# Patient Record
Sex: Female | Born: 2000 | Hispanic: Yes | Marital: Single | Smoking: Never smoker
Health system: Southern US, Community
[De-identification: ages and names within clinical notes are randomized; demographics above are authoritative.]

## PROBLEM LIST (undated history)

## (undated) DIAGNOSIS — N390 Urinary tract infection, site not specified: Secondary | ICD-10-CM

## (undated) DIAGNOSIS — N920 Excessive and frequent menstruation with regular cycle: Secondary | ICD-10-CM

## (undated) HISTORY — PX: APPENDECTOMY: SHX54

## (undated) HISTORY — DX: Excessive and frequent menstruation with regular cycle: N92.0

## (undated) HISTORY — DX: Urinary tract infection, site not specified: N39.0

---
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
## 2018-12-21 ENCOUNTER — Other Ambulatory Visit (HOSPITAL_COMMUNITY)
Admission: RE | Admit: 2018-12-21 | Discharge: 2018-12-21 | Disposition: A | Discharge status: Home / Self Care | Payer: Medicaid Other | Source: Ambulatory Visit | Attending: Obstetrics and Gynecology | Admitting: Obstetrics and Gynecology

## 2018-12-21 ENCOUNTER — Ambulatory Visit (INDEPENDENT_AMBULATORY_CARE_PROVIDER_SITE_OTHER): Payer: Self-pay | Admitting: Obstetrics and Gynecology

## 2018-12-21 ENCOUNTER — Encounter: Payer: Self-pay | Admitting: Obstetrics and Gynecology

## 2018-12-21 ENCOUNTER — Other Ambulatory Visit: Payer: Self-pay

## 2018-12-21 VITALS — BP 120/80 | Ht 60.0 in | Wt 184.6 lb

## 2018-12-21 DIAGNOSIS — Z113 Encounter for screening for infections with a predominantly sexual mode of transmission: Secondary | ICD-10-CM

## 2018-12-21 DIAGNOSIS — N926 Irregular menstruation, unspecified: Secondary | ICD-10-CM

## 2018-12-21 DIAGNOSIS — Z3202 Encounter for pregnancy test, result negative: Secondary | ICD-10-CM

## 2018-12-21 DIAGNOSIS — N939 Abnormal uterine and vaginal bleeding, unspecified: Secondary | ICD-10-CM

## 2018-12-21 DIAGNOSIS — Z6836 Body mass index (BMI) 36.0-36.9, adult: Secondary | ICD-10-CM

## 2018-12-21 LAB — POCT URINE PREGNANCY: Preg Test, Ur: NEGATIVE

## 2018-12-21 MED ORDER — MEDROXYPROGESTERONE ACETATE 10 MG PO TABS: 10.0000 mg | ORAL_TABLET | Freq: Every day | ORAL | 0 refills | Status: DC

## 2018-12-21 NOTE — Progress Notes (Signed)
Pediatrics, [facility] Complaint  Patient presents with  . Metrorrhagia    since last period has been on and off bleeding lasting from 1-2 weeks, no abnormal pain or any other symptoms, pt states most of the time she sees it when she wipes, not on pads    HPI:      [name] is a 18 y.o. G0P0000 who LMP was Patient's last menstrual period was 11/08/2018 (approximate)., presents today for irregular bleeding for the past 1-2 wks. Flow is light and usually noted with wiping only. Menses are usually Q1-4 months (since about age [age]), lasting 5 days, mod flow. No BTB usually, no dysmen. Pt had normal menses 4/20 but has had spotting only the past couple wks. No pelvic pain, LBP, fevers. No urin or vag sx. No hirsutism. No increased stress/wt changes recently, although up 20# from 1/19 appt..   Pt is sex active, using condoms. Neg STD testing 10/18. BC options discussed with Dr. Kenton Bautista 10/18. Has annual 7/20.   Past Medical History:  Diagnosis Date  . Menorrhagia   . UTI (urinary tract infection)     History reviewed. No pertinent surgical history.  Family History  Problem Relation Age of Onset  . Diabetes Father     Social History   Socioeconomic History  . Marital status: Single    Spouse name: Not on file  . Number of children: Not on file  . Years of education: Not on file  . Highest education level: Not on file  Occupational History  . Not on file  Social Needs  . Financial resource strain: Not on file  . Food insecurity:    Worry: Not on file    Inability: Not on file  . Transportation needs:    Medical: Not on file    Non-medical: Not on file  Tobacco Use  . Smoking status: Never Smoker  . Smokeless tobacco: Never Used  Substance and Sexual Activity  . Alcohol use: No  . Drug use: No  . Sexual activity: Yes    Birth control/protection: None  Lifestyle  . Physical activity:    Days per week: Not on file    Minutes per session: Not on file   . Stress: Not on file  Relationships  . Social connections:    Talks on phone: Not on file    Gets together: Not on file    Attends religious service: Not on file    Active member of club or organization: Not on file    Attends meetings of clubs or organizations: Not on file    Relationship status: Not on file  . Intimate partner violence:    Fear of current or ex partner: Not on file    Emotionally abused: Not on file    Physically abused: Not on file    Forced sexual activity: Not on file  Other Topics Concern  . Not on file  Social History Narrative  . Not on file    No outpatient medications prior to visit.   No facility-administered medications prior to visit.       ROS:  Review of Systems  Constitutional: Negative for fever.  Gastrointestinal: Negative for blood in stool, constipation, diarrhea, nausea and vomiting.  Genitourinary: Positive for menstrual problem. Negative for dyspareunia, dysuria, flank pain, frequency, hematuria, urgency, vaginal bleeding, vaginal discharge and vaginal pain.  Musculoskeletal: Negative for back pain.  Skin: Negative for rash.    OBJECTIVE:  Vitals:  BP 120/80   Ht 5' (1.524 m)   Wt 184 lb 9.6 oz (83.7 kg)   LMP 11/08/2018 (Approximate)   BMI 36.05 kg/m   Physical Exam Vitals signs reviewed.  Constitutional:      Appearance: She is well-developed.  Neck:     Musculoskeletal: Normal range of motion.  Pulmonary:     Effort: Pulmonary effort is normal.  Genitourinary:    General: Normal vulva.     Pubic Area: No rash.      Labia:        Right: No rash, tenderness or lesion.        Left: No rash, tenderness or lesion.      Vagina: Bleeding present. No vaginal discharge, erythema or tenderness.     Cervix: Normal.     Uterus: Normal. Not enlarged and not tender.      Adnexa: Right adnexa normal and left adnexa normal.       Right: No mass or tenderness.         Left: No mass or tenderness.    Musculoskeletal:  Normal range of motion.  Skin:    General: Skin is warm and dry.  Neurological:     General: No focal deficit present.     Mental Status: She is alert and oriented to person, place, and time.  Psychiatric:        Mood and Affect: Mood normal.        Behavior: Behavior normal.        Thought Content: Thought content normal.        Judgment: Judgment normal.     Results: Results for orders placed or performed in visit on 12/21/18 (from the past 24 hour(s))  POCT urine pregnancy     Status: Normal   Collection Time: 12/21/18  3:10 PM  Result Value Ref Range   Preg Test, Ur Negative Negative     Assessment/Plan: Abnormal uterine bleeding (AUB) - Spotting for 1-2 wks this cycle. Neg UPT. Check STD. Try provera for withdrawal bleed. Rx eRxd. F/u at 01/13/19 annual/sooner prn. - Plan: POCT urine pregnancy, Cervicovaginal ancillary only, medroxyPROGESTERone (PROVERA) 10 MG tablet  Irregular menses - PCOS discussed as well as cycle control with BC. Pt declines for now.  BMI 36.0-36.9,adult  Screening for STD (sexually transmitted disease) - Plan: Cervicovaginal ancillary only    Meds ordered this encounter  Medications  . medroxyPROGESTERone (PROVERA) 10 MG tablet    Sig: Take 1 tablet (10 mg total) by mouth daily for 7 days.    Dispense:  7 tablet    Refill:  0    Order Specific Question:   Supervising Provider    Answer:   Lisa Bautista, Lisa Bautista [[id number]]      Return if symptoms worsen or fail to improve.  Alicia B. Copland, PA-C 12/21/2018 3:24 PM

## 2018-12-21 NOTE — Patient Instructions (Signed)
I value your feedback and entrusting us with your care. If you get a [facility] patient survey, I would appreciate you taking the time to let us know about your experience today. Thank you! 

## 2018-12-23 LAB — CERVICOVAGINAL ANCILLARY ONLY
Chlamydia: NEGATIVE
Neisseria Gonorrhea: NEGATIVE

## 2019-01-13 ENCOUNTER — Other Ambulatory Visit: Payer: Self-pay

## 2019-01-13 ENCOUNTER — Ambulatory Visit (INDEPENDENT_AMBULATORY_CARE_PROVIDER_SITE_OTHER): Payer: Self-pay | Admitting: Obstetrics and Gynecology

## 2019-01-13 ENCOUNTER — Encounter: Payer: Self-pay | Admitting: Obstetrics and Gynecology

## 2019-01-13 VITALS — BP 118/80 | Ht 60.0 in | Wt 185.6 lb

## 2019-01-13 DIAGNOSIS — N926 Irregular menstruation, unspecified: Secondary | ICD-10-CM

## 2019-01-13 DIAGNOSIS — Z01419 Encounter for gynecological examination (general) (routine) without abnormal findings: Secondary | ICD-10-CM

## 2019-01-13 NOTE — Progress Notes (Signed)
[facility]   Chief Complaint  Patient presents with   Gynecologic Exam     HPI:      [name] is a 18 y.o. G0P0000 who LMP was Patient's last menstrual period was 01/03/2019 (exact date)., presents today for her annual examination.  Her menses are usually Q1-4 months (since about age [age]), lasting 5 days, mod flow. No BTB usually, no dysmen. Spotting for 1-2 wks end of May/early June. Given provera after neg eval and had normal period. No AUB since. Pt declines BC for cycle control.   Sex activity: single partner, contraception - condoms , declines other BC.  Last Pap: N/A Hx of STDs: none; neg STD testing 6/20  There is no FH of breast cancer. There is no FH of ovarian cancer. The patient does not do self-breast exams.  Tobacco use: The patient denies current or previous tobacco use. Alcohol use: none No drug use.  Exercise: moderately active  She does not get adequate calcium and Vitamin D in her diet.   Past Medical History:  Diagnosis Date   Menorrhagia    UTI (urinary tract infection)     History reviewed. No pertinent surgical history.  Family History  Problem Relation Age of Onset   Diabetes Father     Social History   Socioeconomic History   Marital status: Single    Spouse name: Not on file   Number of children: Not on file   Years of education: Not on file   Highest education level: Not on file  Occupational History   Not on file  Social Needs   Financial resource strain: Not on file   Food insecurity    Worry: Not on file    Inability: Not on file   Transportation needs    Medical: Not on file    Non-medical: Not on file  Tobacco Use   Smoking status: Never Smoker   Smokeless tobacco: Never Used  Substance and Sexual Activity   Alcohol use: No   Drug use: No   Sexual activity: Yes    Birth control/protection: None  Lifestyle   Physical activity    Days per week: Not on file    Minutes per  session: Not on file   Stress: Not on file  Relationships   Social connections    Talks on phone: Not on file    Gets together: Not on file    Attends religious service: Not on file    Active member of club or organization: Not on file    Attends meetings of clubs or organizations: Not on file    Relationship status: Not on file   Intimate partner violence    Fear of current or ex partner: Not on file    Emotionally abused: Not on file    Physically abused: Not on file    Forced sexual activity: Not on file  Other Topics Concern   Not on file  Social History Narrative   Not on file    Outpatient Medications Prior to Visit  Medication Sig Dispense Refill   medroxyPROGESTERone (PROVERA) 10 MG tablet Take 1 tablet (10 mg total) by mouth daily for 7 days. 7 tablet 0   No facility-administered medications prior to visit.       ROS:  Review of Systems  Constitutional: Negative for fatigue, fever and unexpected weight change.  Respiratory: Negative for cough, shortness of breath and wheezing.   Cardiovascular: Negative for chest  pain, palpitations and leg swelling.  Gastrointestinal: Negative for blood in stool, constipation, diarrhea, nausea and vomiting.  Endocrine: Negative for cold intolerance, heat intolerance and polyuria.  Genitourinary: Negative for dyspareunia, dysuria, flank pain, frequency, genital sores, hematuria, menstrual problem, pelvic pain, urgency, vaginal bleeding, vaginal discharge and vaginal pain.  Musculoskeletal: Negative for back pain, joint swelling and myalgias.  Skin: Negative for rash.  Neurological: Negative for dizziness, syncope, light-headedness, numbness and headaches.  Hematological: Negative for adenopathy.  Psychiatric/Behavioral: Negative for agitation, confusion, sleep disturbance and suicidal ideas. The patient is not nervous/anxious.    BREAST: No symptoms   Objective: BP 118/80    Ht 5' (1.524 m)    Wt 185 lb 9.6 oz (84.2 kg)     LMP 01/03/2019 (Exact Date)    BMI 36.25 kg/m    Physical Exam Constitutional:      Appearance: She is well-developed.  Genitourinary:     Vulva, vagina, cervix, uterus, right adnexa and left adnexa normal.     No vulval lesion or tenderness noted.     No vaginal discharge, erythema or tenderness.     No cervical polyp.     Uterus is not enlarged or tender.     No right or left adnexal mass present.     Right adnexa not tender.     Left adnexa not tender.  Neck:     Musculoskeletal: Normal range of motion.     Thyroid: No thyromegaly.  Cardiovascular:     Rate and Rhythm: Normal rate and regular rhythm.     Heart sounds: Normal heart sounds. No murmur.  Pulmonary:     Effort: Pulmonary effort is normal.     Breath sounds: Normal breath sounds.  Chest:     Breasts:        Right: No mass, nipple discharge, skin change or tenderness.        Left: No mass, nipple discharge, skin change or tenderness.  Abdominal:     Palpations: Abdomen is soft.     Tenderness: There is no abdominal tenderness. There is no guarding.  Musculoskeletal: Normal range of motion.  Neurological:     General: No focal deficit present.     Mental Status: She is alert and oriented to person, place, and time.     Cranial Nerves: No cranial nerve deficit.  Skin:    General: Skin is warm and dry.  Psychiatric:        Mood and Affect: Mood normal.        Behavior: Behavior normal.        Thought Content: Thought content normal.        Judgment: Judgment normal.  Vitals signs reviewed.     Assessment/Plan: Encounter for annual routine gynecological examination -   Irregular menses - Plan: Sx resolved after provera tx. Had neg UPT/ neg STD testing. Pt declines BC for cycle control. F/u prn sx.        GYN counsel adequate intake of calcium and vitamin D, diet and exercise     F/U  Return in about 1 year (around 01/13/2020).  Juaquin Ludington B. Matty Vanroekel, PA-C 01/13/2019 2:13 PM

## 2019-01-13 NOTE — Patient Instructions (Signed)
I value your feedback and entrusting us with your care. If you get a [facility] patient survey, I would appreciate you taking the time to let us know about your experience today. Thank you! 

## 2019-11-05 IMAGING — CR DG ABDOMEN 2V
1 series · 3 of 3 positions shown · non-contrast
Comparison: CT 02/06/2008

CLINICAL DATA: Right-sided abdominal pain

EXAM:
ABDOMEN - 2 VIEW

[Series 1: dg abd 1 view · 0.14mm/px · 3 of 3 slices shown]
[im 1/3]
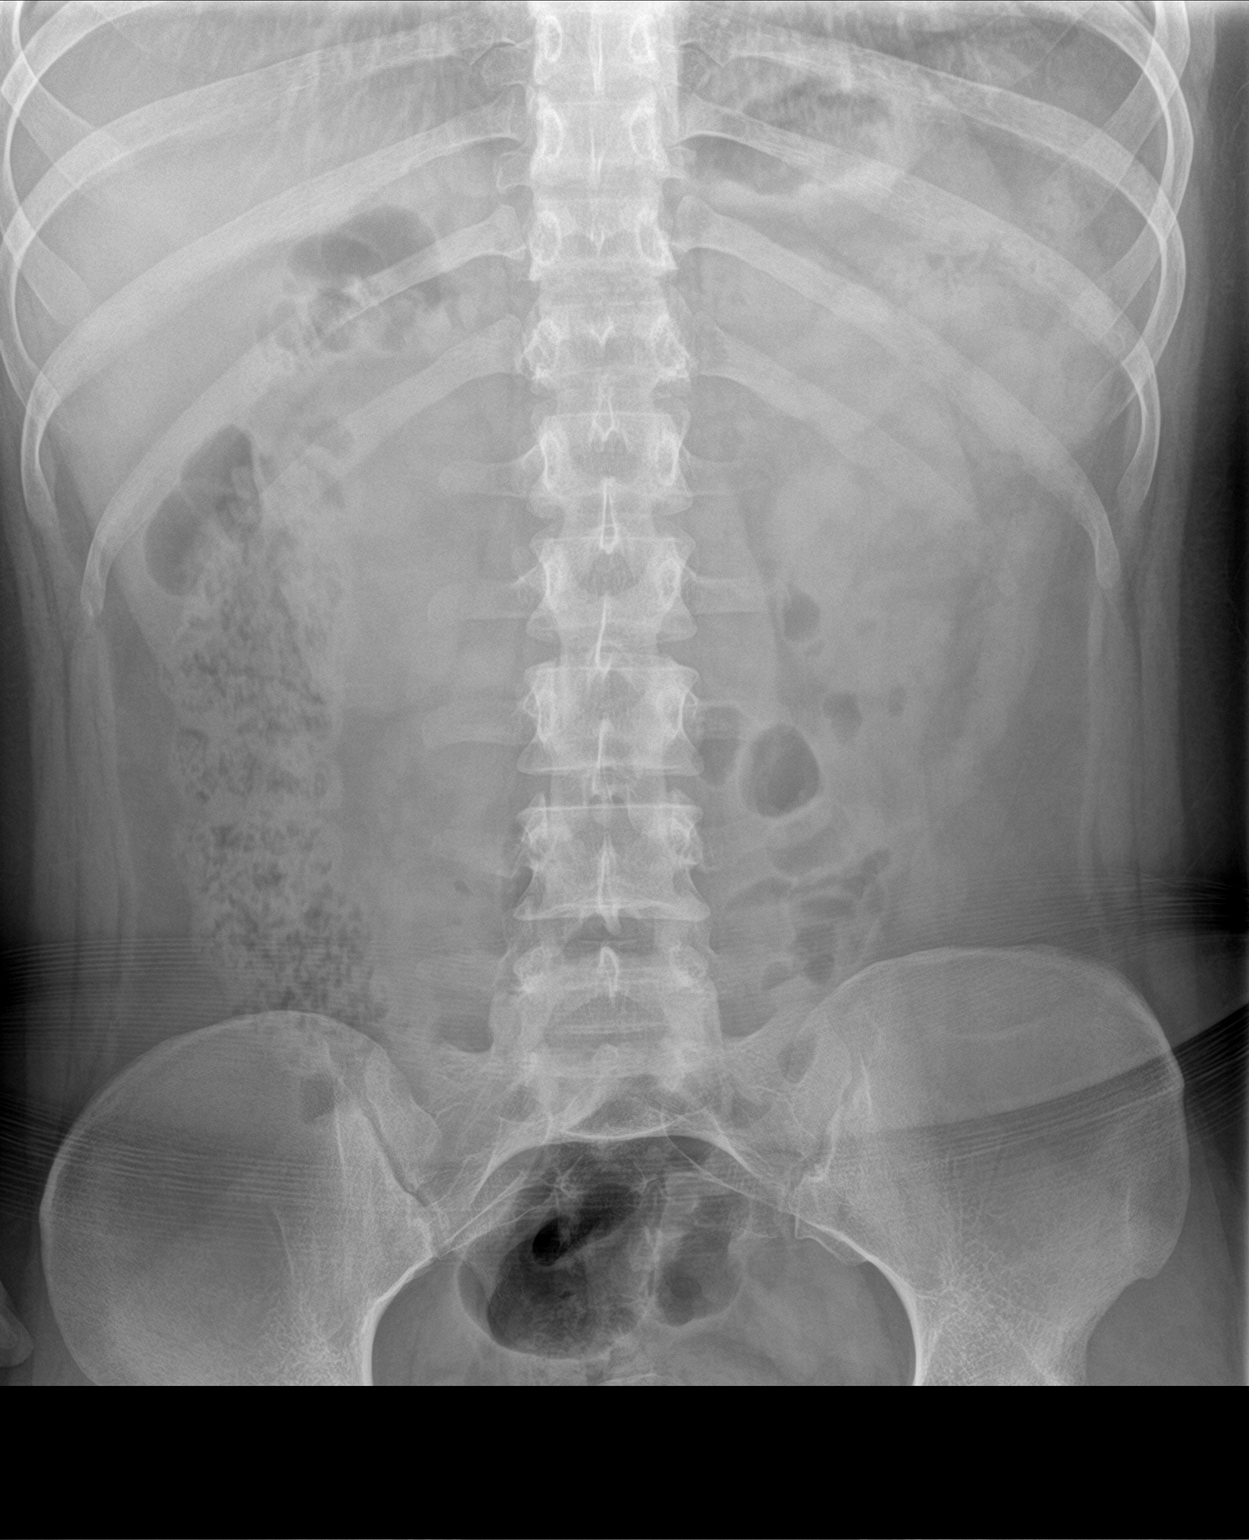
[im 2/3]
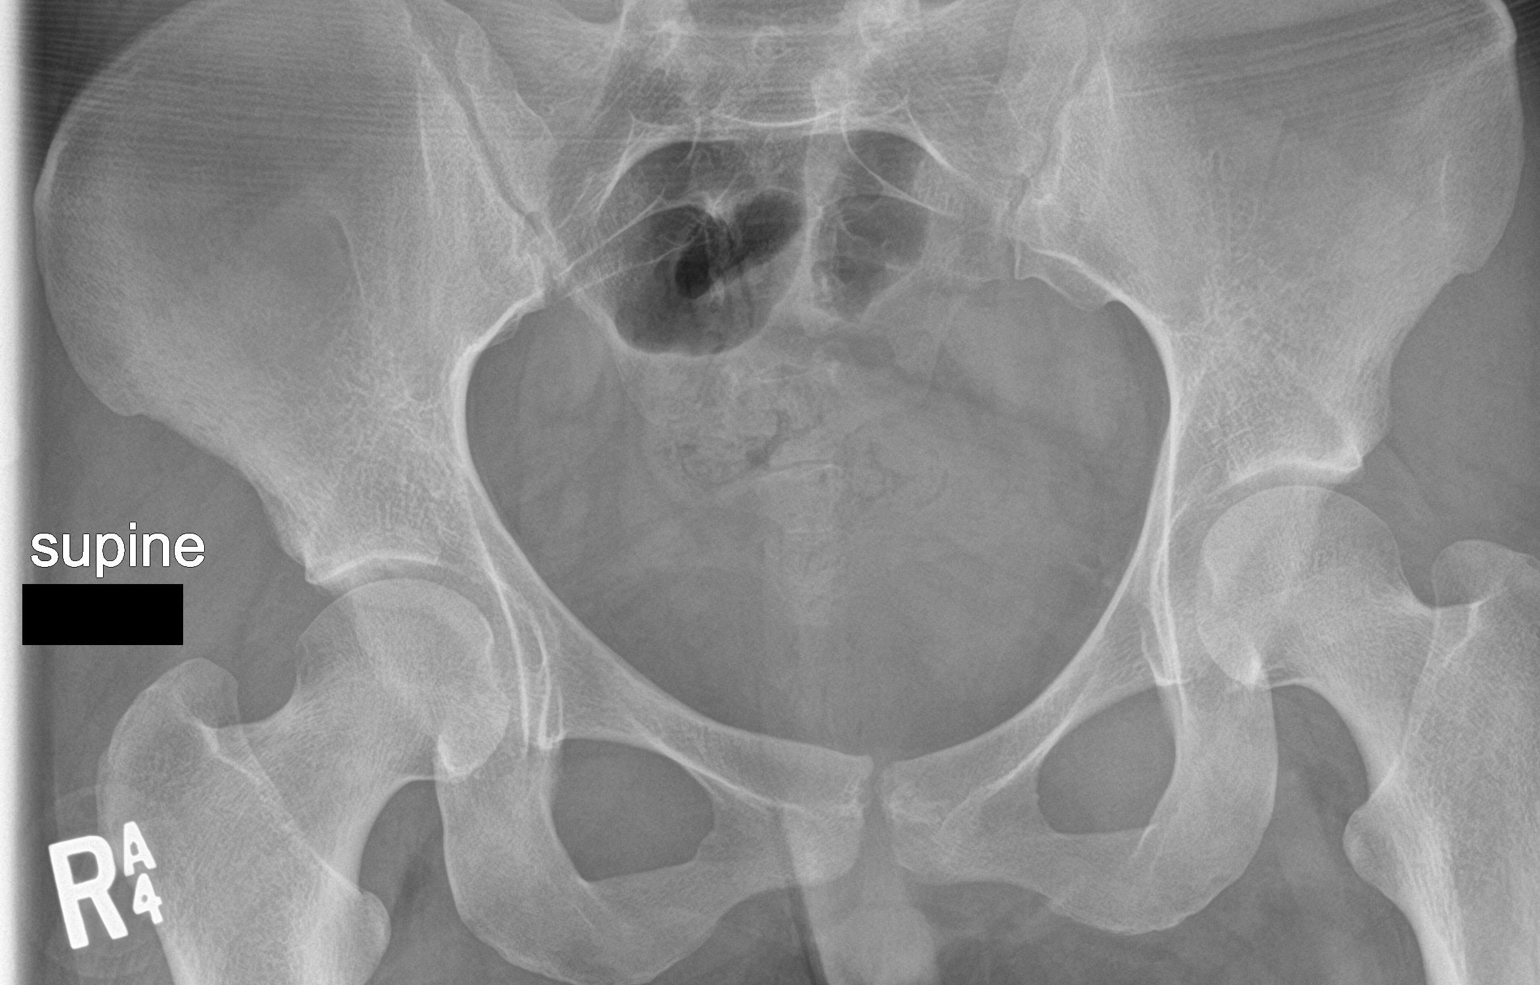
[im 3/3]
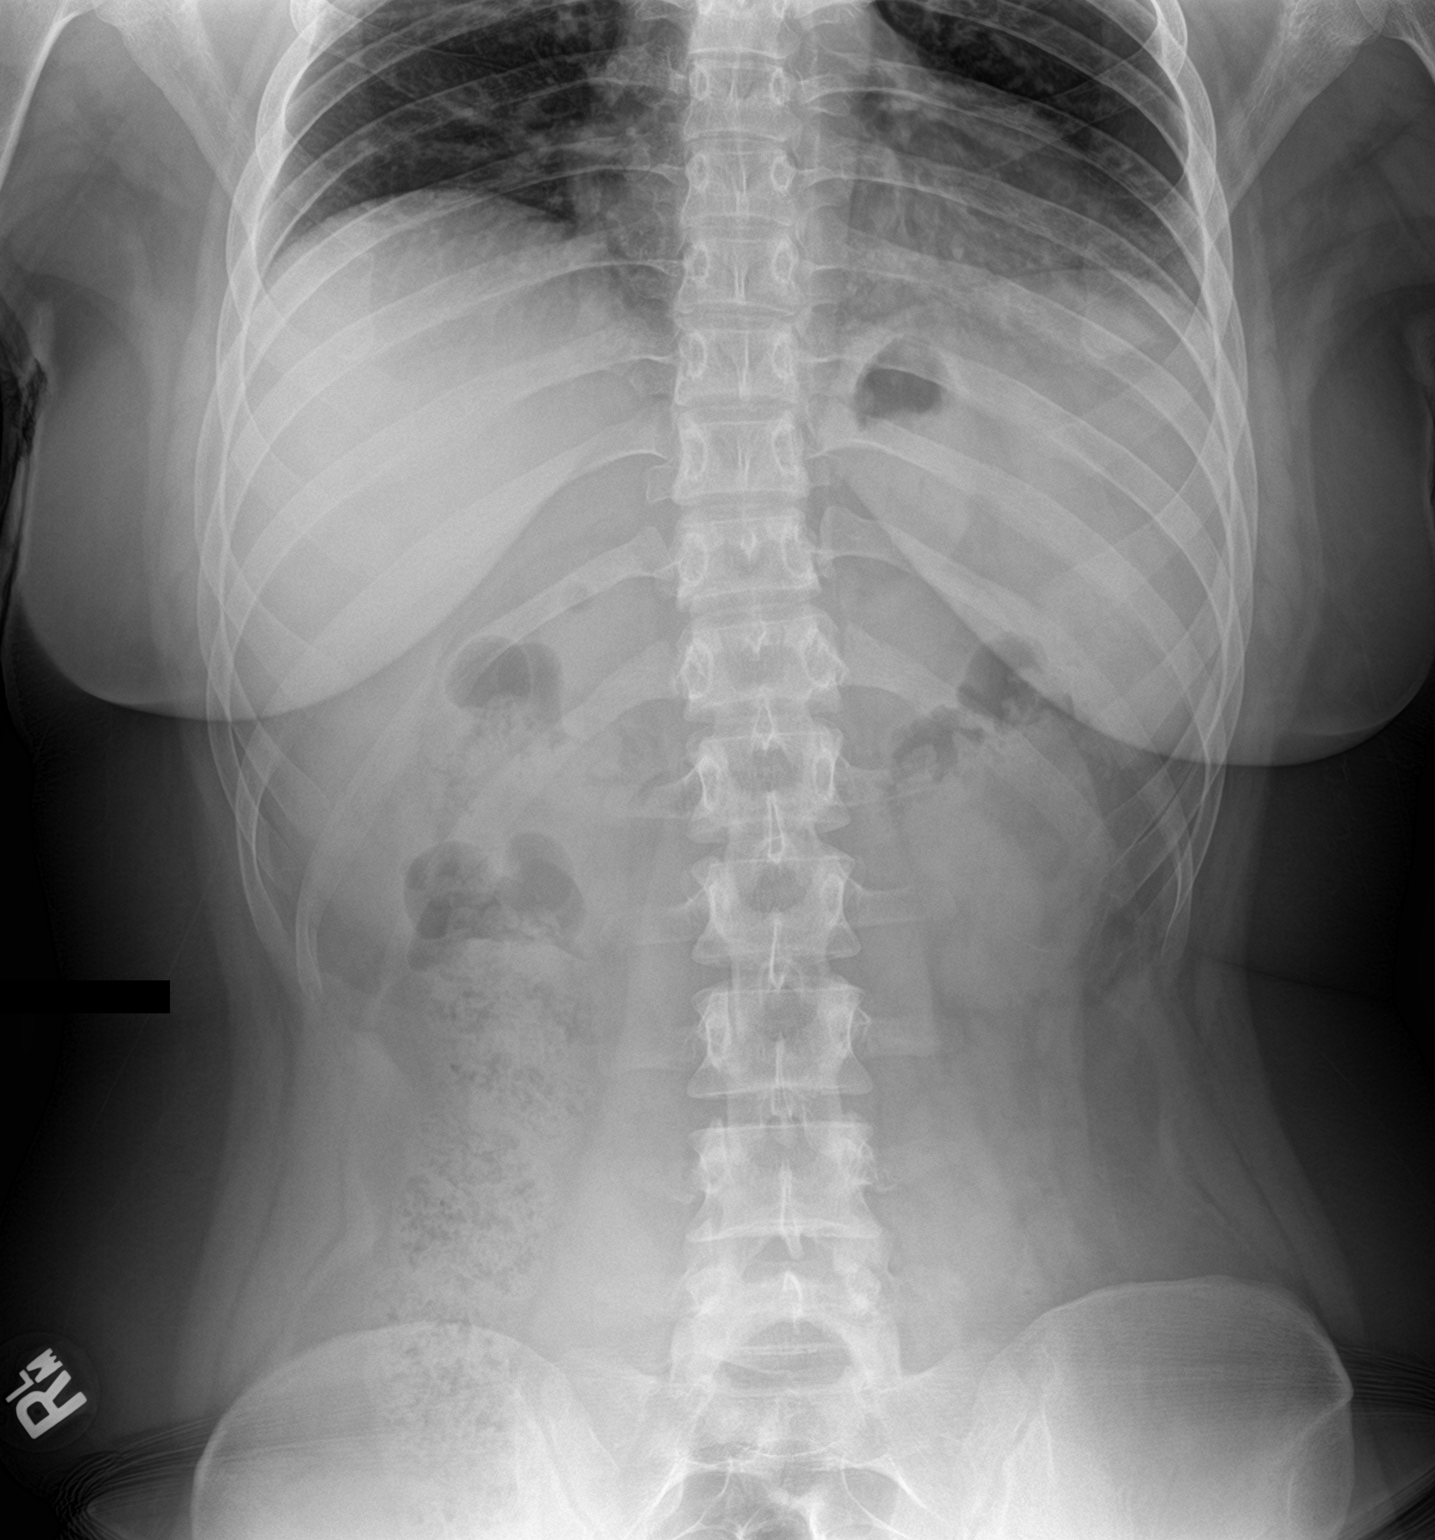

[3 of 3 positions shown; findings below may reference images not displayed]

FINDINGS: No free air beneath the diaphragm. Nonobstructed gas pattern with
moderate stool in the right colon. No radiopaque calculi.
IMPRESSION: Nonobstructed gas pattern.

## 2021-03-01 ENCOUNTER — Other Ambulatory Visit: Payer: Self-pay

## 2021-03-01 ENCOUNTER — Ambulatory Visit (INDEPENDENT_AMBULATORY_CARE_PROVIDER_SITE_OTHER): Payer: Self-pay | Admitting: Obstetrics

## 2021-03-01 ENCOUNTER — Encounter: Payer: Self-pay | Admitting: Obstetrics

## 2021-03-01 ENCOUNTER — Other Ambulatory Visit (HOSPITAL_COMMUNITY)
Admission: RE | Admit: 2021-03-01 | Discharge: 2021-03-01 | Disposition: A | Discharge status: Home / Self Care | Payer: Self-pay | Source: Ambulatory Visit | Attending: Obstetrics | Admitting: Obstetrics

## 2021-03-01 VITALS — BP 126/74 | Ht 60.0 in | Wt 180.0 lb

## 2021-03-01 DIAGNOSIS — Z01419 Encounter for gynecological examination (general) (routine) without abnormal findings: Secondary | ICD-10-CM

## 2021-03-01 DIAGNOSIS — Z124 Encounter for screening for malignant neoplasm of cervix: Secondary | ICD-10-CM | POA: Insufficient documentation

## 2021-03-01 DIAGNOSIS — Z113 Encounter for screening for infections with a predominantly sexual mode of transmission: Secondary | ICD-10-CM

## 2021-03-01 DIAGNOSIS — Z30011 Encounter for initial prescription of contraceptive pills: Secondary | ICD-10-CM

## 2021-03-01 MED ORDER — NORETHIN ACE-ETH ESTRAD-FE 1-20 MG-MCG PO TABS: 1.0000 | ORAL_TABLET | Freq: Every day | ORAL | 11 refills | Status: AC

## 2021-03-01 NOTE — Progress Notes (Signed)
Gynecology Annual Exam  PCP: Pediatrics, [facility]  Chief Complaint:  Chief Complaint  Patient presents with   Annual Exam    History of Present Illness:  [name] is a 20 y.o. G0P0000 who LMP was Patient's last menstrual period was 02/07/2021 (approximate)., presents today for her annual examination. 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  Her menses are irregular, lasting 7 day(s).  She has been treated with Provera in the past for irregular cycles. Dysmenorrhea none. She does not have intermenstrual bleeding.  She is multiple partners, contraception - none and has sex with males. She has had two partners in the last year. She does use condoms. Recently used Plan B. Last Pap: never had due to her age   Hx of STDs: none  There is no FH of breast cancer. There is no FH of ovarian cancer. The patient does not do self-breast exams.  Tobacco use: The patient denies current or previous tobacco use. Alcohol use: social drinker Exercise: not active    The patient wears seatbelts: yes.   The patient reports that domestic violence in her life is absent.   Past Medical History:  Diagnosis Date   Menorrhagia    UTI (urinary tract infection)     No past surgical history on file.  Prior to Admission medications   Not on File    No Known Allergies  Gynecologic History: Patient's last menstrual period was 02/07/2021 (approximate). History of abnormal pap smear: No History of STI: No   Obstetric History: G0P0000  Social History   Socioeconomic History   Marital status: Single    Spouse name: Not on file   Number of children: Not on file   Years of education: Not on file   Highest education level: Not on file  Occupational History   Not on file  Tobacco Use   Smoking status: Never   Smokeless tobacco: Never  Vaping Use   Vaping  Use: Never used  Substance and Sexual Activity   Alcohol use: No   Drug use: No   Sexual activity: Yes    Birth control/protection: None  Other Topics Concern   Not on file  Social History Narrative   Not on file   Social Determinants of Health   Financial Resource Strain: Not on file  Food Insecurity: Not on file  Transportation Needs: Not on file  Physical Activity: Not on file  Stress: Not on file  Social Connections: Not on file  Intimate Partner Violence: Not on file    Family History  Problem Relation Age of Onset   Diabetes Father     Review of Systems  Constitutional: Negative.   HENT: Negative.    Eyes: Negative.   Respiratory: Negative.    Cardiovascular: Negative.   Gastrointestinal: Negative.   Skin: Negative.   All other systems reviewed and are negative.   Physical Exam BP 126/74   Ht 5' (1.524 m)   Wt 180 lb (81.6 kg)   LMP 02/07/2021 (Approximate)   BMI 35.15 kg/m    Physical Exam Constitutional:      Appearance: Normal appearance. She is obese.  Genitourinary:     Vulva and rectum normal.     Genitourinary Comments: Normal female genitalia. No rashes or lesions noted. Bimanual: uterus is anteverted, non enlarged, mobile. No adnexal tenderness or masses palpated.  First pelvic exam and pap smear today.  HENT:     Head: Normocephalic and atraumatic.     Nose: Nose normal.  Cardiovascular:     Rate and Rhythm: Normal rate and regular rhythm.     Pulses: Normal pulses.     Heart sounds: Normal heart sounds.  Pulmonary:     Effort: Pulmonary effort is normal.     Breath sounds: Normal breath sounds.  Abdominal:     General: Bowel sounds are normal.     Palpations: Abdomen is soft.     Comments: adipose  Musculoskeletal:        General: Normal range of motion.     Cervical back: Normal range of motion and neck supple.  Neurological:     General: No focal deficit present.     Mental Status: She is alert and oriented to person, place,  and time.  Skin:    General: Skin is warm and dry.  Psychiatric:        Mood and Affect: Mood normal.        Behavior: Behavior normal.     Comments: Hx of GAD- does not use medications    Female chaperone present for pelvic and breast  portions of the physical exam      Assessment: 20 y.o. G0P0000 female here for routine annual gynecologic examination Pap smear and STI screening Plan: Problem List Items Addressed This Visit   None   Screening: -- Blood pressure screen normal -- Weight screening: obese: discussed management options, including lifestyle, dietary, and exercise. -- Depression screening negative (PHQ-9) -- Nutrition: normal -- cholesterol screening: not due for screening -- osteoporosis screening: not due -- tobacco screening: not using -- alcohol screening: AUDIT questionnaire indicates low-risk usage. -- family history of breast cancer screening: done. not at high risk. -- no evidence of domestic violence or intimate partner violence. -- STD screening: gonorrhea/chlamydia NAAT collected -- pap smear collected per ASCCP guidelines -- flu vaccine  declines today -- HPV vaccination series:  is uncertain if she has received this  Mirna Mires, CNM  03/01/2021 12:14 PM   03/01/2021 9:34 AM

## 2021-03-02 LAB — HEP, RPR, HIV PANEL
HIV Screen 4th Generation wRfx: NONREACTIVE
Hepatitis B Surface Ag: NEGATIVE
RPR Ser Ql: NONREACTIVE

## 2021-03-04 LAB — CYTOLOGY - PAP: Diagnosis: NEGATIVE

## 2021-03-06 ENCOUNTER — Encounter: Payer: Self-pay | Admitting: Obstetrics

## 2021-03-06 NOTE — Progress Notes (Signed)
Letter sent to pt regarding her NILM pap and negative STI screen. Mirna Mires, CNM  03/06/2021 1:42 PM

## 2021-12-03 ENCOUNTER — Ambulatory Visit: Payer: Self-pay | Admitting: Nurse Practitioner

## 2021-12-03 ENCOUNTER — Encounter: Payer: Self-pay | Admitting: Nurse Practitioner

## 2021-12-03 DIAGNOSIS — Z113 Encounter for screening for infections with a predominantly sexual mode of transmission: Secondary | ICD-10-CM

## 2021-12-03 LAB — WET PREP FOR TRICH, YEAST, CLUE
Trichomonas Exam: NEGATIVE
Yeast Exam: NEGATIVE

## 2021-12-03 LAB — HM HIV SCREENING LAB: HM HIV Screening: NEGATIVE

## 2021-12-03 NOTE — Progress Notes (Signed)
[facility] Department  STI clinic/screening visit [address] [place] [address] [phone]  Subjective:  Lisa Bautista is a 21 y.o. female being seen today for an STI screening visit. The patient reports they do have symptoms.  Patient reports that they do not desire a pregnancy in the next year.   They reported they are not interested in discussing contraception today.  Patient to return back to clinic to discuss birth control options.    Patient's last menstrual period was 11/28/2021.   Patient has the following medical conditions:   Patient Active Problem List   Diagnosis Date Noted   BMI 36.0-36.9,adult 12/21/2018   Menorrhagia with regular cycle 04/22/2017    Chief Complaint  Patient presents with   SEXUALLY TRANSMITTED DISEASE    Screening    HPI  Patient reports to clinic today for STD screening.  Patient reports lower abdominal pain that started one week ago and odor that started one month ago.    Last HIV test per patient/review of record was 03/01/21 Patient reports last pap was 03/01/21.   Screening for MPX risk: Does the patient have an unexplained rash? No Is the patient MSM? No Does the patient endorse multiple sex partners or anonymous sex partners? No Did the patient have close or sexual contact with a person diagnosed with MPX? No Has the patient traveled outside the [place] where MPX is endemic? No Is there a high clinical suspicion for MPX-- evidenced by one of the following No  -Unlikely to be chickenpox  -Lymphadenopathy  -Rash that present in same phase of evolution on any given body part See flowsheet for further details and programmatic requirements.   Immunization history:  Immunization History  Administered Date(s) Administered   Hepatitis A 01/11/2007, 07/22/2007   Hepatitis B 01/17/1999, 03/21/1999, 05/21/1999   Hpv-Unspecified 04/17/2009, 06/19/2009, 10/22/2009   Tdap 01/02/2010     The following portions of the  patient's history were reviewed and updated as appropriate: allergies, current medications, past medical history, past social history, past surgical history and problem list.  Objective:  There were no vitals filed for this visit.  Physical Exam Constitutional:      Appearance: Normal appearance.  HENT:     Head: Normocephalic.     Right Ear: External ear normal.     Left Ear: External ear normal.     Nose: Nose normal.     Mouth/Throat:     Lips: Pink.     Mouth: Mucous membranes are moist.     Comments: No visible signs of dental caries  Pulmonary:     Effort: Pulmonary effort is normal.  Abdominal:     General: Abdomen is flat.     Palpations: Abdomen is soft.  Genitourinary:    Comments: External genitalia/pubic area without nits, lice, edema, erythema, lesions and inguinal adenopathy. Vagina with normal mucosa and discharge. Cervix without visible lesions. Uterus firm, mobile, nt, no masses, no CMT, no adnexal tenderness or fullness. pH 4.5. Musculoskeletal:     Cervical back: Full passive range of motion without pain, normal range of motion and neck supple.  Skin:    General: Skin is warm and dry.  Neurological:     Mental Status: She is alert and oriented to person, place, and time.  Psychiatric:        Attention and Perception: Attention normal.        Mood and Affect: Mood normal.        Speech: Speech  normal.        Behavior: Behavior normal. Behavior is cooperative.     Assessment and Plan:  Lisa Bautista is a 21 y.o. female presenting to the [facility] Department for STI screening  1. Screening examination for venereal disease -21 year old female STD screening. -Patient accepted all screenings including oral GC, vaginal CT/GC and bloodwork for HIV/RPR.  Patient meets criteria for HepB screening? No. Ordered? No - low risk Patient meets criteria for HepC screening? No. Ordered? No - low risk   Treat wet prep per standing order Discussed time  line for [facility] results and that patient will be called with positive results and encouraged patient to call if she had not heard in 2 weeks.  Counseled to return or seek care for continued or worsening symptoms Recommended condom use with all sex  Patient is currently using  condoms  to prevent pregnancy.    - HIV [facility] - Chlamydia/Gonorrhea [facility] - Syphilis Serology, [place] [facility] - WET PREP FOR TRICH, YEAST, CLUE - Gonococcus culture    Return if symptoms worsen or fail to improve.    Glenna Fellows, FNP

## 2021-12-03 NOTE — Progress Notes (Signed)
Pt here for STD screening.  Wet mount results reviewed, no treatment required.  Condoms declined.  Greenlee Ancheta M Rochell Puett, RN' 

## 2021-12-07 LAB — GONOCOCCUS CULTURE

## 2022-07-22 ENCOUNTER — Ambulatory Visit: Payer: Self-pay
